# Patient Record
Sex: Female | Born: 1960 | Race: Black or African American | Hispanic: No | Smoking: Never smoker
Health system: Southern US, Community
[De-identification: ages and names within clinical notes are randomized; demographics above are authoritative.]

## PROBLEM LIST (undated history)

## (undated) DIAGNOSIS — I10 Essential (primary) hypertension: Secondary | ICD-10-CM

## (undated) DIAGNOSIS — M329 Systemic lupus erythematosus, unspecified: Secondary | ICD-10-CM

## (undated) DIAGNOSIS — M549 Dorsalgia, unspecified: Secondary | ICD-10-CM

## (undated) DIAGNOSIS — G8929 Other chronic pain: Secondary | ICD-10-CM

## (undated) DIAGNOSIS — N200 Calculus of kidney: Secondary | ICD-10-CM

## (undated) DIAGNOSIS — R001 Bradycardia, unspecified: Secondary | ICD-10-CM

## (undated) HISTORY — PX: ABDOMINAL HYSTERECTOMY: SHX81

## (undated) HISTORY — PX: CHOLECYSTECTOMY: SHX55

---

## 2014-03-11 ENCOUNTER — Emergency Department (HOSPITAL_COMMUNITY)
Admission: EM | Admit: 2014-03-11 | Discharge: 2014-03-11 | Disposition: A | Discharge status: Home / Self Care | Payer: 59 | Attending: Emergency Medicine | Admitting: Emergency Medicine

## 2014-03-11 ENCOUNTER — Emergency Department (HOSPITAL_COMMUNITY): Payer: 59

## 2014-03-11 ENCOUNTER — Encounter (HOSPITAL_COMMUNITY): Payer: Self-pay | Admitting: Emergency Medicine

## 2014-03-11 DIAGNOSIS — N201 Calculus of ureter: Secondary | ICD-10-CM | POA: Insufficient documentation

## 2014-03-11 DIAGNOSIS — Z79899 Other long term (current) drug therapy: Secondary | ICD-10-CM | POA: Insufficient documentation

## 2014-03-11 DIAGNOSIS — I10 Essential (primary) hypertension: Secondary | ICD-10-CM | POA: Insufficient documentation

## 2014-03-11 DIAGNOSIS — G8929 Other chronic pain: Secondary | ICD-10-CM | POA: Insufficient documentation

## 2014-03-11 DIAGNOSIS — M549 Dorsalgia, unspecified: Secondary | ICD-10-CM | POA: Insufficient documentation

## 2014-03-11 HISTORY — DX: Calculus of kidney: N20.0

## 2014-03-11 HISTORY — DX: Other chronic pain: G89.29

## 2014-03-11 HISTORY — DX: Systemic lupus erythematosus, unspecified: M32.9

## 2014-03-11 HISTORY — DX: Dorsalgia, unspecified: M54.9

## 2014-03-11 HISTORY — DX: Essential (primary) hypertension: I10

## 2014-03-11 HISTORY — DX: Bradycardia, unspecified: R00.1

## 2014-03-11 LAB — COMPREHENSIVE METABOLIC PANEL
ALK PHOS: 89 U/L (ref 39–117)
ALT: 13 U/L (ref 0–35)
AST: 16 U/L (ref 0–37)
Albumin: 3.9 g/dL (ref 3.5–5.2)
BILIRUBIN TOTAL: 0.3 mg/dL (ref 0.3–1.2)
BUN: 23 mg/dL (ref 6–23)
CHLORIDE: 104 meq/L (ref 96–112)
CO2: 27 meq/L (ref 19–32)
CREATININE: 1.15 mg/dL — AB (ref 0.50–1.10)
Calcium: 9.9 mg/dL (ref 8.4–10.5)
GFR calc Af Amer: 61 mL/min — ABNORMAL LOW (ref >90)
GFR calc non Af Amer: 53 mL/min — ABNORMAL LOW (ref >90)
Glucose, Bld: 127 mg/dL — ABNORMAL HIGH (ref 70–99)
Potassium: 4.6 mEq/L (ref 3.7–5.3)
Sodium: 143 mEq/L (ref 137–147)
Total Protein: 7.7 g/dL (ref 6.0–8.3)

## 2014-03-11 LAB — LIPASE, BLOOD: LIPASE: 35 U/L (ref 11–59)

## 2014-03-11 LAB — CBC WITH DIFFERENTIAL/PLATELET
Basophils Absolute: 0 10*3/uL (ref 0.0–0.1)
Basophils Relative: 0 % (ref 0–1)
Eosinophils Absolute: 0 10*3/uL (ref 0.0–0.7)
Eosinophils Relative: 0 % (ref 0–5)
HCT: 40.6 % (ref 36.0–46.0)
HEMOGLOBIN: 13.3 g/dL (ref 12.0–15.0)
LYMPHS ABS: 1.3 10*3/uL (ref 0.7–4.0)
Lymphocytes Relative: 16 % (ref 12–46)
MCH: 30.2 pg (ref 26.0–34.0)
MCHC: 32.8 g/dL (ref 30.0–36.0)
MCV: 92.1 fL (ref 78.0–100.0)
Monocytes Absolute: 0.4 10*3/uL (ref 0.1–1.0)
Monocytes Relative: 5 % (ref 3–12)
NEUTROS ABS: 6.4 10*3/uL (ref 1.7–7.7)
NEUTROS PCT: 79 % — AB (ref 43–77)
Platelets: 183 10*3/uL (ref 150–400)
RBC: 4.41 MIL/uL (ref 3.87–5.11)
RDW: 12.9 % (ref 11.5–15.5)
WBC: 8.1 10*3/uL (ref 4.0–10.5)

## 2014-03-11 LAB — URINALYSIS, ROUTINE W REFLEX MICROSCOPIC
Bilirubin Urine: NEGATIVE
GLUCOSE, UA: NEGATIVE mg/dL
KETONES UR: NEGATIVE mg/dL
Leukocytes, UA: NEGATIVE
Nitrite: NEGATIVE
PROTEIN: NEGATIVE mg/dL
Specific Gravity, Urine: 1.024 (ref 1.005–1.030)
Urobilinogen, UA: 0.2 mg/dL (ref 0.0–1.0)
pH: 7 (ref 5.0–8.0)

## 2014-03-11 LAB — URINE MICROSCOPIC-ADD ON

## 2014-03-11 MED ORDER — ONDANSETRON HCL 4 MG PO TABS: 4.0000 mg | ORAL_TABLET | Freq: Four times a day (QID) | ORAL | Status: AC

## 2014-03-11 MED ORDER — HYDROMORPHONE HCL PF 1 MG/ML IJ SOLN
1.0000 mg | Freq: Once | INTRAMUSCULAR | Status: AC
  Administered 2014-03-11: 1 mg via INTRAMUSCULAR
  Filled 2014-03-11: qty 1

## 2014-03-11 MED ORDER — ONDANSETRON 4 MG PO TBDP
8.0000 mg | ORAL_TABLET | Freq: Once | ORAL | Status: AC
  Administered 2014-03-11: 8 mg via ORAL
  Filled 2014-03-11: qty 2

## 2014-03-11 MED ORDER — HYDROMORPHONE HCL PF 1 MG/ML IJ SOLN: 1.0000 mg | Freq: Once | INTRAMUSCULAR | Status: DC

## 2014-03-11 MED ORDER — ONDANSETRON HCL 4 MG/2ML IJ SOLN: 4.0000 mg | Freq: Once | INTRAMUSCULAR | Status: DC

## 2014-03-11 MED ORDER — OXYCODONE-ACETAMINOPHEN 5-325 MG PO TABS: 2.0000 | ORAL_TABLET | Freq: Four times a day (QID) | ORAL | Status: AC | PRN

## 2014-03-11 NOTE — ED Notes (Signed)
Iv team called will come see pt.

## 2014-03-11 NOTE — Discharge Instructions (Signed)

## 2014-03-11 NOTE — ED Provider Notes (Signed)
CSN: [id number]     Arrival date & time 03/11/14  0840 History   First MD Initiated Contact with Patient 03/11/14 [phone]     Chief Complaint  Patient presents with  . Flank Pain     (Consider location/radiation/quality/duration/timing/severity/associated sxs/prior Treatment) HPI Comments: Patient is a 53 year old female with history of chronic back pain, lupus, hypertension, kidney stones, sinus bradycardia who presents today with left flank pain. It is sharp in nature and begins in her left upper back. It wraps around her side it radiates into her left lower abdomen. She has associated nausea without vomiting. She reports that she has been dry heaving. She believes this is due to the severe pain she is in. No dysuria, hematuria, difficulty with urination. No fevers or chills. This feels like her prior kidney stones. She has required surgical intervention in the past for her kidney stones.   Patient is a 53 y.o. female presenting with flank pain. The history is provided by the patient. No language interpreter was used.  Flank Pain Associated symptoms include nausea. Pertinent negatives include no abdominal pain, chest pain, chills, fever or vomiting.    Past Medical History  Diagnosis Date  . Bradycardia   . Chronic back pain   . Lupus   . Hypertension   . Kidney calculi    Past Surgical History  Procedure Laterality Date  . Cholecystectomy    . Abdominal hysterectomy     History reviewed. No pertinent family history. History  Substance Use Topics  . Smoking status: Never Smoker   . Smokeless tobacco: Not on file  . Alcohol Use: No   OB History   Grav Para Term Preterm Abortions TAB SAB Ect Mult Living            0     Review of Systems  Constitutional: Negative for fever and chills.  Respiratory: Negative for shortness of breath.   Cardiovascular: Negative for chest pain.  Gastrointestinal: Positive for nausea. Negative for vomiting and abdominal pain.  Genitourinary:  Positive for flank pain. Negative for dysuria and difficulty urinating.  All other systems reviewed and are negative.     Allergies  Lyrica and Azithromycin  Home Medications   Prior to Admission medications   Medication Sig Start Date End Date Taking? Authorizing Provider  buPROPion (WELLBUTRIN XL) 150 MG 24 hr tablet Take 150 mg by mouth daily. 02/21/14  Yes Historical Provider, MD  gabapentin (NEURONTIN) 300 MG capsule Take 300 mg by mouth daily.   Yes Historical Provider, MD  naproxen (NAPROSYN) 500 MG tablet Take 500 mg by mouth daily.   Yes Historical Provider, MD  naproxen sodium (ANAPROX) 220 MG tablet Take 220-440 mg by mouth daily as needed (for pain).   Yes Historical Provider, MD  omeprazole (PRILOSEC) 40 MG capsule Take 40 mg by mouth daily. 02/13/14  Yes Historical Provider, MD  sertraline (ZOLOFT) 100 MG tablet Take 100 mg by mouth daily. 01/01/14  Yes Historical Provider, MD  Tapentadol HCl (NUCYNTA) 100 MG TABS Take 100 mg by mouth 3 (three) times daily as needed (for pain).   Yes Historical Provider, MD   BP 176/83  Pulse 48  Temp(Src) 97.7 F (36.5 C) (Oral)  Resp 18  SpO2 99% Physical Exam  Nursing note and vitals reviewed. Constitutional: She is oriented to person, place, and time. She appears well-developed and well-nourished. No distress.  HENT:  Head: Normocephalic and atraumatic.  Right Ear: External ear normal.  Left Ear: External ear normal.  Nose: Nose normal.  Mouth/Throat: Oropharynx is clear and moist.  Eyes: Conjunctivae are normal.  Neck: Normal range of motion.  Cardiovascular: Normal rate, regular rhythm and normal heart sounds.   Pulmonary/Chest: Effort normal and breath sounds normal. No stridor. No respiratory distress. She has no wheezes. She has no rales.  Abdominal: Soft. She exhibits no distension. There is tenderness. There is no rigidity, no rebound and no guarding.    Musculoskeletal: Normal range of motion.  Neurological: She is  alert and oriented to person, place, and time. She has normal strength.  Skin: Skin is warm and dry. She is not diaphoretic. No erythema.  Psychiatric: She has a normal mood and affect. Her behavior is normal.    ED Course  Procedures (including critical care time) Labs Review Labs Reviewed  CBC WITH DIFFERENTIAL - Abnormal; Notable for the following:    Neutrophils Relative % 79 (*)    All other components within normal limits  COMPREHENSIVE METABOLIC PANEL - Abnormal; Notable for the following:    Glucose, Bld 127 (*)    Creatinine, Ser 1.15 (*)    GFR calc non Af Amer 53 (*)    GFR calc Af Amer 61 (*)    All other components within normal limits  URINALYSIS, ROUTINE W REFLEX MICROSCOPIC - Abnormal; Notable for the following:    APPearance CLOUDY (*)    Hgb urine dipstick MODERATE (*)    All other components within normal limits  URINE CULTURE  LIPASE, BLOOD  URINE MICROSCOPIC-ADD ON    Imaging Review Ct Abdomen Pelvis Wo Contrast  03/11/2014   CLINICAL DATA:  Left-sided flank pain radiating into the left groin.  EXAM: CT ABDOMEN AND PELVIS WITHOUT CONTRAST  TECHNIQUE: Multidetector CT imaging of the abdomen and pelvis was performed following the standard protocol without IV contrast.  COMPARISON:  No priors.  FINDINGS: Lung Bases: Calcified left hilar lymph nodes. Areas of dependent atelectasis in the lower lobes of the lungs bilaterally.  Abdomen/Pelvis: Numerous large nonobstructive calculi are present within the collecting systems of the kidneys bilaterally, largest of which measures 1 cm in the upper pole collecting system of the right kidney. In addition, image 60 of series 2 demonstrates a 5 mm calculus in the middle third of the left ureter associated with mild proximal left hydroureteronephrosis and extensive left perinephric stranding, compatible with a partially obstructive calculus. No bladder calculi are noted. The unenhanced appearance of the kidneys is otherwise  unremarkable bilaterally.  Status post cholecystectomy. The unenhanced appearance of the liver, pancreas and bilateral adrenal glands is unremarkable. Numerous small calcifications within the spleen, compatible with calcified granulomas. Normal appendix. No significant volume of ascites. No pneumoperitoneum. No pathologic distention of small bowel. No lymphadenopathy identified within the abdomen or pelvis on today's non contrast CT examination. Status post hysterectomy. Ovaries are not confidently identified may be surgically absent or atrophic. Urinary bladder is unremarkable in appearance.  Musculoskeletal: Status post L5 laminectomy and PLIF at L5-S1. There are no aggressive appearing lytic or blastic lesions noted in the visualized portions of the skeleton.  IMPRESSION: 1. Partially obstructive 5 mm calculus in the middle third of the left ureter with mild proximal hydroureteronephrosis and perinephric stranding. 2. Multiple additional nonobstructive calculi throughout the collecting systems of the kidneys bilaterally, largest of which measures up to 1 cm in the upper pole collecting system of the right kidney. Sequela of old granulomatous disease, as above. 3. Additional incidental findings, as above.   Electronically Signed  By: Trudie Reedaniel  Entrikin M.D.   On: 03/11/2014 11:24     EKG Interpretation None      MDM   Final diagnoses:  Ureteral calculi    Patient presents to ED with left flank pain. Hx of kidney stones. She was found to have 5mm partially obstructive calculus in the middle third of the left ureter with mild proximal hydroureteronephrosis. Creatinine found to be 1.15. UA shows no evidence of infection. Patient's pain was controlled in the ED with dilaudid. She was sent home with percocet and instructed to follow up with her urologist. She [place] in [place] and is driving home tonight. Discussed return instructions with the patient and reasons to stop at the closest Emergency  Department. Discussed case with Dr. Fonnie JarvisBednar who agrees with plan. Vital signs stable for discharge. Patient / Family / Caregiver informed of clinical course, understand medical decision-making process, and agree with plan.     Mora BellmanHannah S Merrell, PA-C 03/11/14 1559

## 2014-03-11 NOTE — ED Notes (Signed)
Patient transported to CT 

## 2014-03-11 NOTE — ED Notes (Signed)
Attempted IV times 2 unsuccessful, will have 2nd RN attempt

## 2014-03-11 NOTE — ED Notes (Signed)
Pt reports left flank pain that radiates to groin since last night. Reports nausea, but denies emesis or diarrhea. Denies dysuria, hematuria. Denies fever or chills.

## 2014-03-12 NOTE — ED Provider Notes (Signed)
Medical screening examination/treatment/procedure(s) were performed by non-physician practitioner and as supervising physician I was immediately available for consultation/collaboration.   EKG Interpretation None       John M Bednar, MD 03/12/14 1444 

## 2014-03-13 LAB — URINE CULTURE: Colony Count: 10000

## 2014-12-05 IMAGING — CT CT ABD-PELV W/O CM
2 of 4 series · 15 of 46 positions shown, 17 images · non-contrast
Comparison: No priors.

CLINICAL DATA: Left-sided flank pain radiating into the left groin.

EXAM:
CT ABDOMEN AND PELVIS WITHOUT CONTRAST
TECHNIQUE: Multidetector CT imaging of the abdomen and pelvis was performed
following the standard protocol without IV contrast.

[Series 2: stone study 5.0 i30f 1 · axial · 0.86mm/px · z∈[-662,-242]mm · 12 of 96 slices shown, 14 images]
[im 8/96  soft-tissue]
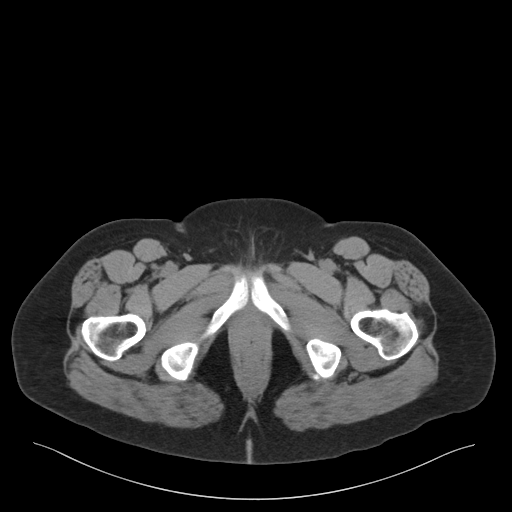
[im 8/96  bone]
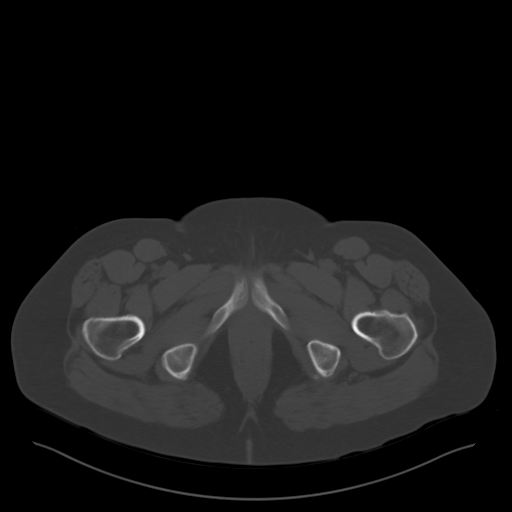
[im 16/96  soft-tissue]
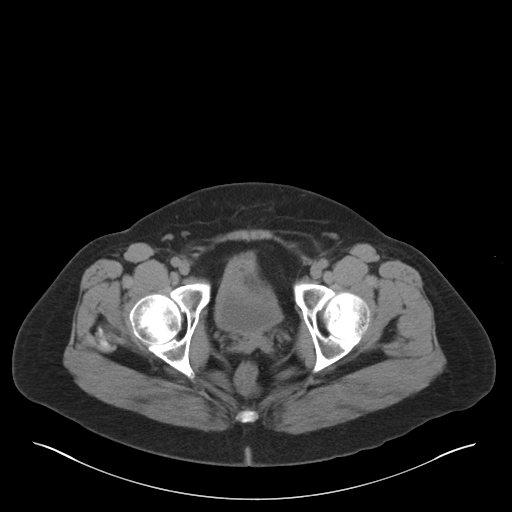
[im 23/96  soft-tissue]
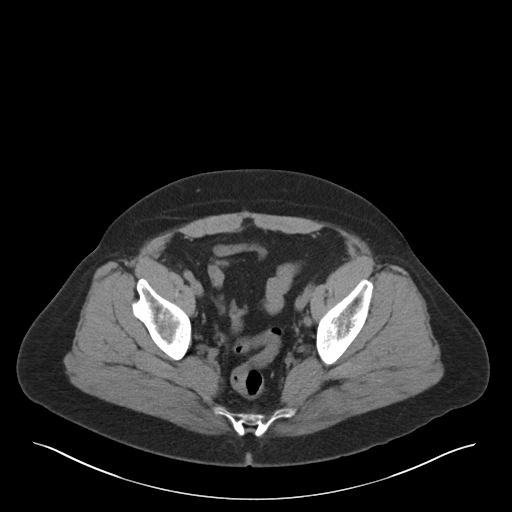
[im 31/96  soft-tissue]
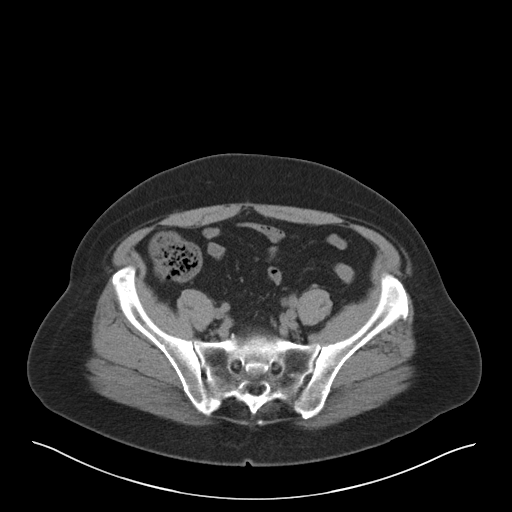
[im 39/96  soft-tissue]
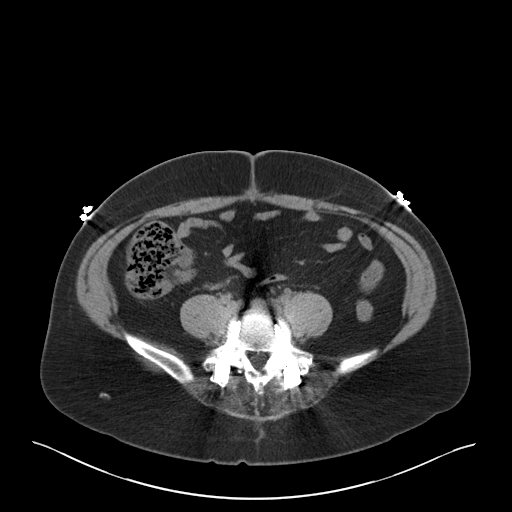
[im 46/96  soft-tissue]
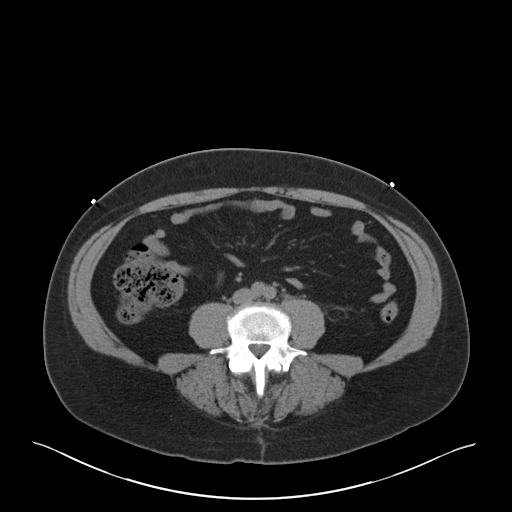
[im 54/96  soft-tissue]
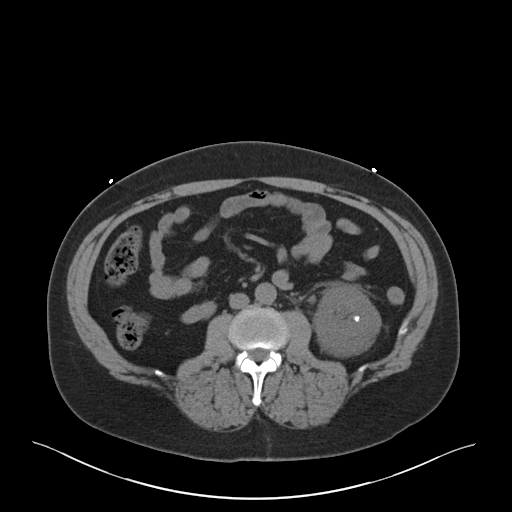
[im 61/96  soft-tissue]
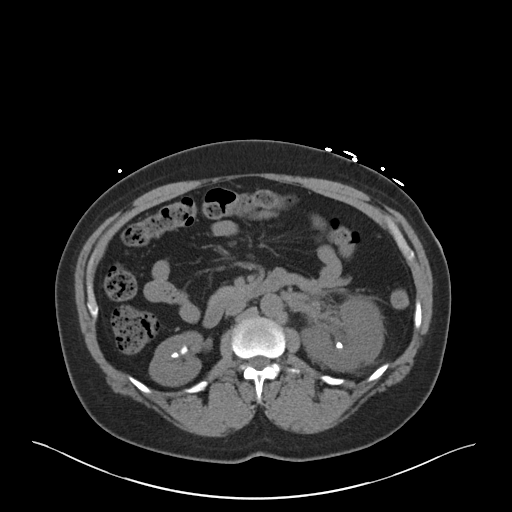
[im 69/96  soft-tissue]
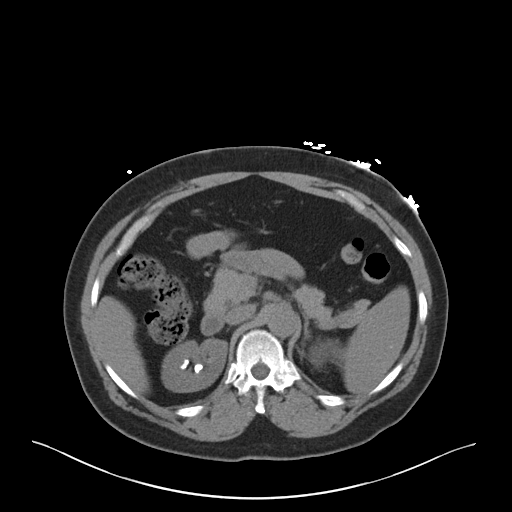
[im 69/96  bone]
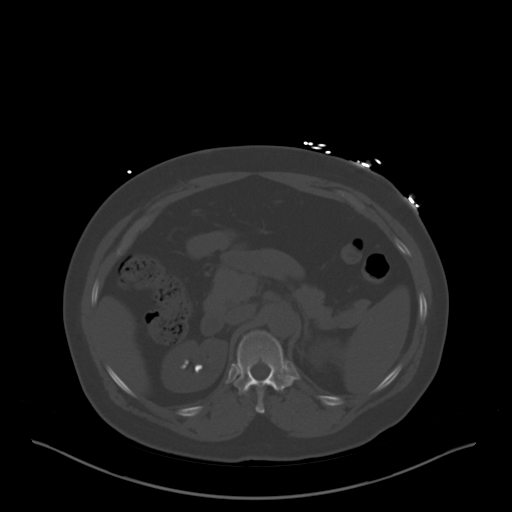
[im 77/96  soft-tissue]
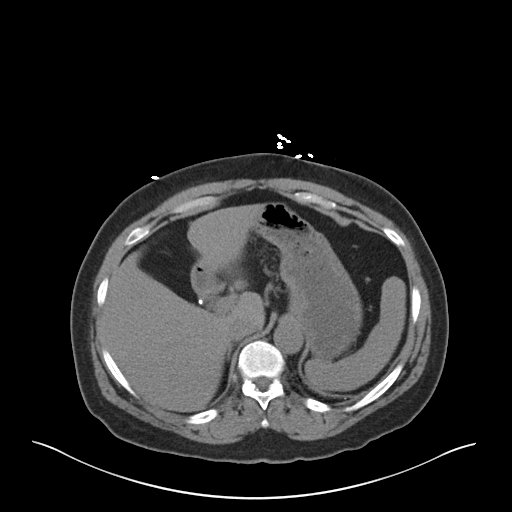
[im 84/96  soft-tissue]
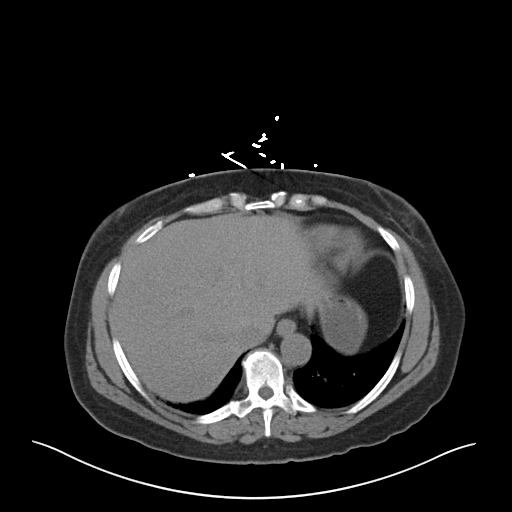
[im 92/96  soft-tissue]
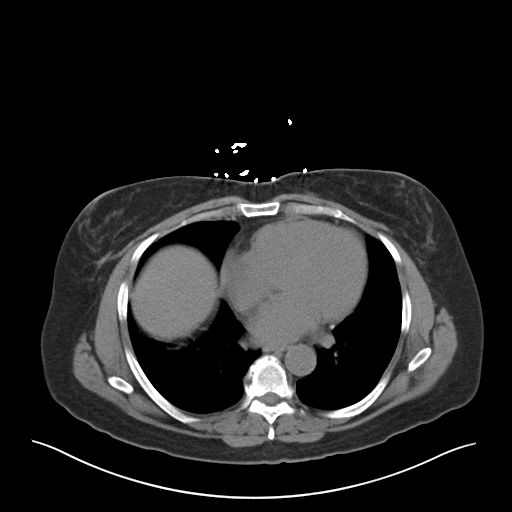

[Series 5: coronal soft tissue · coronal · 0.98mm/px · 3 of 90 slices shown]
[im 30/90  soft-tissue]
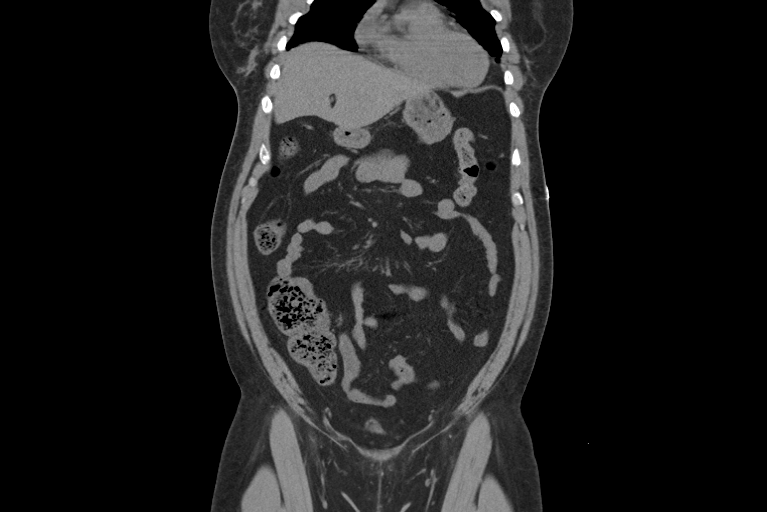
[im 40/90  soft-tissue]
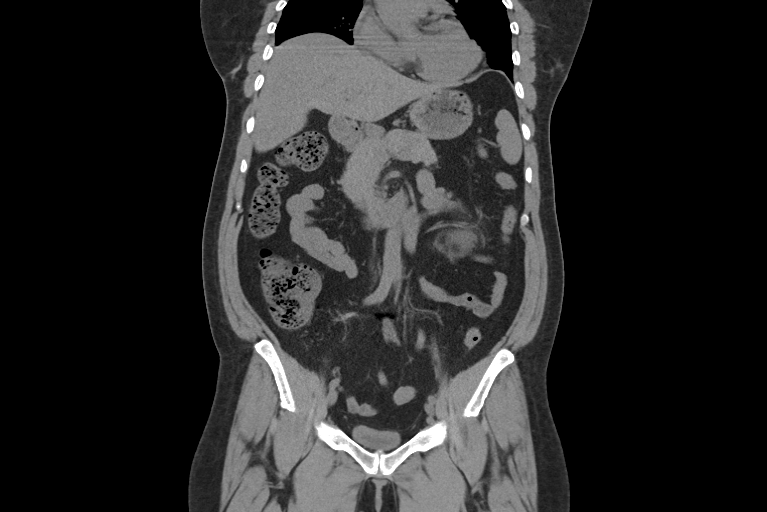
[im 50/90  soft-tissue]
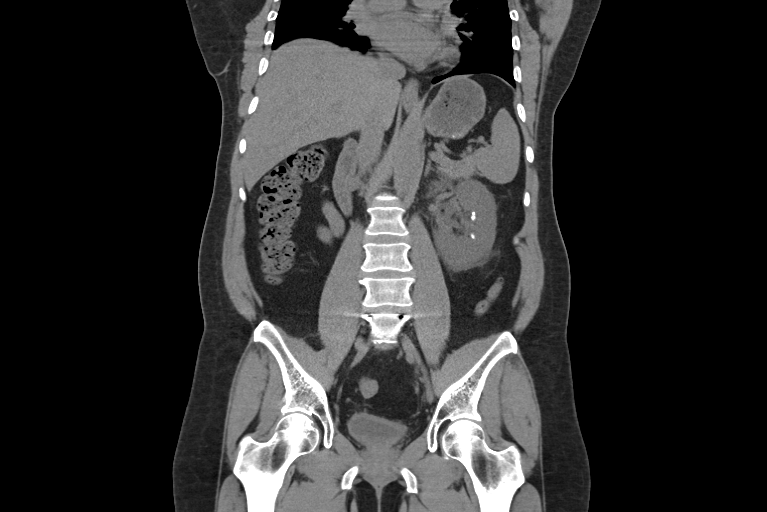

[15 of 46 positions shown; findings below may reference images not displayed]

FINDINGS: Lung Bases: Calcified left hilar lymph nodes. Areas of dependent
atelectasis in the lower lobes of the lungs bilaterally.

Abdomen/Pelvis: Numerous large nonobstructive calculi are present
within the collecting systems of the kidneys bilaterally, largest of
which measures 1 cm in the upper pole collecting system of the right
kidney. In addition, image 60 of series 2 demonstrates a 5 mm
calculus in the middle third of the left ureter associated with mild
proximal left hydroureteronephrosis and extensive left perinephric
stranding, compatible with a partially obstructive calculus. No
bladder calculi are noted. The unenhanced appearance of the kidneys
is otherwise unremarkable bilaterally.

Status post cholecystectomy. The unenhanced appearance of the liver,
pancreas and bilateral adrenal glands is unremarkable. Numerous
small calcifications within the spleen, compatible with calcified
granulomas. Normal appendix. No significant volume of ascites. No
pneumoperitoneum. No pathologic distention of small bowel. No
lymphadenopathy identified within the abdomen or pelvis on today's
non contrast CT examination. Status post hysterectomy. Ovaries are
not confidently identified may be surgically absent or atrophic.
Urinary bladder is unremarkable in appearance.

Musculoskeletal: Status post L5 laminectomy and PLIF at L5-S1. There
are no aggressive appearing lytic or blastic lesions noted in the
visualized portions of the skeleton.
IMPRESSION: 1. Partially obstructive 5 mm calculus in the middle third of the
left ureter with mild proximal hydroureteronephrosis and perinephric
stranding.
2. Multiple additional nonobstructive calculi throughout the
collecting systems of the kidneys bilaterally, largest of which
measures up to 1 cm in the upper pole collecting system of the right
kidney. Sequela of old granulomatous disease, as above.
3. Additional incidental findings, as above.
